# Patient Record
Sex: Male | Born: 1955 | Race: Black or African American | Hispanic: No | Smoking: Former smoker
Health system: Southern US, Community
[De-identification: ages and names within clinical notes are randomized; demographics above are authoritative.]

## PROBLEM LIST (undated history)

## (undated) DIAGNOSIS — I509 Heart failure, unspecified: Secondary | ICD-10-CM

## (undated) DIAGNOSIS — I4891 Unspecified atrial fibrillation: Secondary | ICD-10-CM

## (undated) DIAGNOSIS — E119 Type 2 diabetes mellitus without complications: Secondary | ICD-10-CM

## (undated) DIAGNOSIS — M109 Gout, unspecified: Secondary | ICD-10-CM

## (undated) DIAGNOSIS — E7800 Pure hypercholesterolemia, unspecified: Secondary | ICD-10-CM

## (undated) DIAGNOSIS — I10 Essential (primary) hypertension: Secondary | ICD-10-CM

## (undated) HISTORY — DX: Heart failure, unspecified: I50.9

## (undated) HISTORY — PX: OTHER SURGICAL HISTORY: SHX169

## (undated) HISTORY — DX: Pure hypercholesterolemia, unspecified: E78.00

## (undated) HISTORY — DX: Type 2 diabetes mellitus without complications: E11.9

## (undated) HISTORY — DX: Gout, unspecified: M10.9

## (undated) HISTORY — DX: Essential (primary) hypertension: I10

## (undated) HISTORY — DX: Unspecified atrial fibrillation: I48.91

## (undated) HISTORY — PX: MULTIPLE TOOTH EXTRACTIONS: SHX2053

---

## 2015-10-25 HISTORY — PX: COLONOSCOPY: SHX174

## 2017-02-03 ENCOUNTER — Encounter: Payer: Self-pay | Admitting: Internal Medicine

## 2017-02-21 HISTORY — PX: PACEMAKER PLACEMENT: SHX43

## 2017-03-05 ENCOUNTER — Encounter: Payer: Self-pay | Admitting: Gastroenterology

## 2017-03-05 ENCOUNTER — Ambulatory Visit (INDEPENDENT_AMBULATORY_CARE_PROVIDER_SITE_OTHER): Payer: Medicare Other | Admitting: Gastroenterology

## 2017-03-05 VITALS — BP 110/68 | HR 62 | Temp 97.0°F | Ht 72.0 in | Wt 223.2 lb

## 2017-03-05 DIAGNOSIS — R7989 Other specified abnormal findings of blood chemistry: Secondary | ICD-10-CM | POA: Diagnosis not present

## 2017-03-05 DIAGNOSIS — R945 Abnormal results of liver function studies: Principal | ICD-10-CM

## 2017-03-05 NOTE — Patient Instructions (Signed)
Please have blood work done today.  I am getting the results from [place]. Most likely, you will need an updated CT.  Further recommendations to follow!

## 2017-03-05 NOTE — Progress Notes (Signed)
Primary Care Physician:  Margy Clarks, NP Primary Gastroenterologist:  Dr. Gala Romney   Chief Complaint  Patient presents with  . Weight Loss    HPI:   Phillip Spears is a 61 y.o. male presenting today at the request of his PCP secondary to weight loss.  Labs dated May 2018 from [place] note Hgb 10.8, Hct 35.5, microcytic anemia, platelets 232, Albumin 3.2, BUN 32, Cr 1.77, Tbili 1.6, Alk Phos 245, AST 203, ALT 114, A1c 8.4, Outside labs from March 01, 2017 at [facility] with bilirubin 3.6, Alk Phos 191, Albumin 1.7, AST 134, ALT 79, Platelets 139  AICD placed in June 2018 at [facility]. History of chronic systolic heart failure, non-ischemic cardiomyopathy, afib.   Ex-wife, who is a friend, is present at visit.  Colonoscopy Feb 2017: Dr. Earley Brooke. Adenomatous polyps.   US abdomen done a few months ago per patient, but I do not have these results. Lost from 235 to 220. Feels nauseated but denies pain. Nausea is better since AICD placed. Doesn't have much of an appetite. Food doesn't taste the same for about a month. Swelling in lower extremities much improved since AICD placement. Amiodarone for years. Cut it back a few months ago from twice a day to once a day. History of alcohol use, quitting 4 months ago.   CT chest completed May 2018: negative for occult malignancy, no acute findings in chest.   Past Medical History:  Diagnosis Date  . Atrial fibrillation ([place])   . Diabetes ([place])   . Gout   . Heart failure ([place])   . Hypercholesterolemia   . Hypertension     Past Surgical History:  Procedure Laterality Date  . COLONOSCOPY  10/2015   Dr. Earley Brooke: hepatic flexure polyps (adenomatous), diverticula, small internal hemorrhoids. Remote history of adenomas as well.   . MULTIPLE TOOTH EXTRACTIONS    . PACEMAKER PLACEMENT  02/2017   ACID at [facility]  . venous surgery bilateral legs      Current Outpatient Prescriptions  Medication Sig Dispense Refill  . allopurinol (ZYLOPRIM) 300 MG tablet  Take by mouth.    Marland Kitchen amiodarone (PACERONE) 200 MG tablet Take by mouth.    . furosemide (LASIX) 40 MG tablet Take 40 mg by mouth 2 (two) times daily.    . insulin aspart (NOVOLOG FLEXPEN) 100 UNIT/ML FlexPen Sliding scale    . insulin detemir (LEVEMIR) 100 UNIT/ML injection 40 units SQ QAM then 30 units SQ QHS    . linagliptin (TRADJENTA) 5 MG TABS tablet Take by mouth.    . metoprolol succinate (TOPROL-XL) 100 MG 24 hr tablet Take by mouth.    . rosuvastatin (CRESTOR) 10 MG tablet Take by mouth.    . warfarin (COUMADIN) 5 MG tablet Take by mouth.     No current facility-administered medications for this visit.     Allergies as of 03/05/2017 - Review Complete 03/05/2017  Allergen Reaction Noted  . Amoxicillin Diarrhea 02/27/2017  . Enalapril Cough 02/03/2017  . Metformin hcl Diarrhea 02/03/2017  . Olmesartan Other (See Comments) 02/27/2017    Family History  Problem Relation Age of Onset  . Colon cancer Neg Hx   . Pancreatic cancer Neg Hx     Social History   Social History  . Marital status: Unknown    Spouse name: N/A  . Number of children: N/A  . Years of education: N/A   Occupational History  . Not on file.   Social History Main Topics  .  Smoking status: Former Smoker    Packs/day: 1.00    Quit date: 2008  . Smokeless tobacco: Never Used  . Alcohol use No     Comment: quit 4 months ago. sometimes drink on the weekends, sometimes just during the week   . Drug use: No  . Sexual activity: Not on file   Other Topics Concern  . Not on file   Social History Narrative  . No narrative on file    Review of Systems: Gen: see HPI  CV: Denies chest pain, heart palpitations, peripheral edema, syncope.  Resp: +DOE  GI: see HPI  GU : Denies urinary burning, urinary frequency, urinary hesitancy MS: gout  Derm: Denies rash, itching, dry skin Psych: Denies depression, anxiety, memory loss, and confusion Heme: Denies bruising, bleeding, and enlarged lymph  nodes.  Physical Exam: BP 110/68   Pulse 62   Temp 97 F (36.1 C) (Oral)   Ht 6' (1.829 m)   Wt 223 lb 3.2 oz (101.2 kg)   BMI 30.27 kg/m  General:   Alert and oriented. Pleasant and cooperative. Well-nourished and well-developed.  Head:  Normocephalic and atraumatic. Eyes:  Mild scleral icterus  Ears:  Normal auditory acuity. Nose:  No deformity, discharge,  or lesions. Mouth:  No deformity or lesions, oral mucosa pink.  Lungs:  Clear to auscultation bilaterally. No wheezes, rales, or rhonchi. No distress.  Heart:  S1, S2 present without murmurs appreciated.  Abdomen:  +BS, soft, non-tender and non-distended. No HSM noted. No guarding or rebound. No masses appreciated.  Rectal:  Deferred  Msk:  Symmetrical without gross deformities. Normal posture. Extremities:  Without edema. Neurologic:  Alert and  oriented x4 Psych:  Alert and cooperative. Normal mood and affect.

## 2017-03-06 LAB — BASIC METABOLIC PANEL WITH GFR
BUN: 44 mg/dL — ABNORMAL HIGH (ref 7–25)
CALCIUM: 8.2 mg/dL — AB (ref 8.6–10.3)
CO2: 21 mmol/L (ref 20–31)
Chloride: 101 mmol/L (ref 98–110)
Creat: 2.05 mg/dL — ABNORMAL HIGH (ref 0.70–1.33)
GFR, EST NON AFRICAN AMERICAN: 34 mL/min — AB (ref >=60)
GFR, Est African American: 40 mL/min — ABNORMAL LOW (ref >=60)
GLUCOSE: 239 mg/dL — AB (ref 65–99)
Potassium: 4.8 mmol/L (ref 3.5–5.3)
Sodium: 135 mmol/L (ref 135–146)

## 2017-03-06 LAB — HEPATITIS A ANTIBODY, TOTAL: HEP A TOTAL AB: NONREACTIVE

## 2017-03-06 LAB — HEPATIC FUNCTION PANEL
ALK PHOS: 267 U/L — AB (ref 40–115)
ALT: 107 U/L — AB (ref 9–46)
AST: 218 U/L — AB (ref 10–35)
Albumin: 2.6 g/dL — ABNORMAL LOW (ref 3.6–5.1)
BILIRUBIN INDIRECT: 1.5 mg/dL — AB (ref 0.2–1.2)
Bilirubin, Direct: 1.5 mg/dL — ABNORMAL HIGH (ref <=0.2)
TOTAL PROTEIN: 6.1 g/dL (ref 6.1–8.1)
Total Bilirubin: 3 mg/dL — ABNORMAL HIGH (ref 0.2–1.2)

## 2017-03-06 LAB — FERRITIN: Ferritin: 253 ng/mL (ref 20–380)

## 2017-03-06 LAB — IRON AND TIBC
%SAT: 22 % (ref 15–60)
Iron: 46 ug/dL — ABNORMAL LOW (ref 50–180)
TIBC: 213 ug/dL — ABNORMAL LOW (ref 250–425)
UIBC: 167 ug/dL

## 2017-03-06 LAB — HEPATITIS B SURFACE ANTIBODY,QUALITATIVE: HEP B S AB: NEGATIVE

## 2017-03-06 LAB — HEPATITIS C ANTIBODY: HCV AB: NEGATIVE

## 2017-03-06 LAB — HEPATITIS B CORE ANTIBODY, TOTAL: HEP B C TOTAL AB: NONREACTIVE

## 2017-03-06 LAB — HEPATITIS B SURFACE ANTIGEN: HEP B S AG: NEGATIVE

## 2017-03-07 ENCOUNTER — Encounter: Payer: Self-pay | Admitting: Gastroenterology

## 2017-03-07 ENCOUNTER — Other Ambulatory Visit: Payer: Self-pay

## 2017-03-07 DIAGNOSIS — R7989 Other specified abnormal findings of blood chemistry: Secondary | ICD-10-CM | POA: Insufficient documentation

## 2017-03-07 DIAGNOSIS — R945 Abnormal results of liver function studies: Principal | ICD-10-CM

## 2017-03-07 DIAGNOSIS — R634 Abnormal weight loss: Secondary | ICD-10-CM

## 2017-03-07 NOTE — Assessment & Plan Note (Signed)
61 year old male presenting with weight loss, elevated LFTs that are trending upwards in mixed pattern, and reported fatty liver. No recent CT completed that I am aware other than CT chest. No abdominal imaging in the way of CT. Extensive cardiac history, with [facility]. Recovering well from this. Colonoscopy up-to-date as of last year with adenomatous polyps. No EGD that I am aware.  Check HFP, hepatitis serologies, iron studies in setting of anemia. CT abd/pelvis with oral contrast and reduced dose IV contrast scheduled for Monday. Further recommendations to follow. Does not appear acutely ill at time of visit. Continue ETOH cessation, which he stopped 4 months ago.

## 2017-03-07 NOTE — Progress Notes (Signed)
Ferritin elevated at 253 but sats are 22, unlikely iron overload. Viral markers negative. LFTs elevated in mixed pattern, similar to prior results but have been increasing. I reviewed outside studies. He only had a CT chest that was negative.Marland Kitchen. Unfortunately, his renal function is not the best. I spoke with radiology, and can give reduced dose of IV contrast if GFR greater than 30. However, let's do the following:  1. Needs CT abd/pelvis with REDUCED DOSING OF IV CONTRAST (put in comments) and still have oral contrast. Check creatinine stat prior to CT in case he is unable to have any IV contrast. Reason: increasing LFTs 2. Needs to be Monday at the latest. May need to do stat if possible.

## 2017-03-10 ENCOUNTER — Ambulatory Visit (HOSPITAL_COMMUNITY)
Admission: RE | Admit: 2017-03-10 | Discharge: 2017-03-10 | Disposition: A | Discharge status: Home / Self Care | Payer: Medicare Other | Source: Ambulatory Visit | Attending: Gastroenterology | Admitting: Gastroenterology

## 2017-03-10 DIAGNOSIS — R7989 Other specified abnormal findings of blood chemistry: Secondary | ICD-10-CM | POA: Insufficient documentation

## 2017-03-10 DIAGNOSIS — R634 Abnormal weight loss: Secondary | ICD-10-CM | POA: Insufficient documentation

## 2017-03-10 DIAGNOSIS — R938 Abnormal findings on diagnostic imaging of other specified body structures: Secondary | ICD-10-CM | POA: Insufficient documentation

## 2017-03-10 DIAGNOSIS — K5730 Diverticulosis of large intestine without perforation or abscess without bleeding: Secondary | ICD-10-CM | POA: Insufficient documentation

## 2017-03-10 DIAGNOSIS — I700 Atherosclerosis of aorta: Secondary | ICD-10-CM | POA: Diagnosis not present

## 2017-03-10 DIAGNOSIS — R932 Abnormal findings on diagnostic imaging of liver and biliary tract: Secondary | ICD-10-CM | POA: Insufficient documentation

## 2017-03-10 DIAGNOSIS — R945 Abnormal results of liver function studies: Secondary | ICD-10-CM

## 2017-03-10 MED ORDER — IOPAMIDOL (ISOVUE-300) INJECTION 61%
INTRAVENOUS | Status: AC
  Administered 2017-03-10: 80 mL
  Filled 2017-03-10: qty 30

## 2017-03-10 NOTE — Progress Notes (Signed)
CC'ED TO PCP 

## 2017-03-12 NOTE — Progress Notes (Signed)
CT with likely cirrhosis. No acute findings. With elevated LFTs and cirrhosis need to continue absolute avoidance of ETOH.  Appears to have chronic anemia. Negative viral markers. Needs additional labs to include: AMA, ANA, ASMA, ceruloplasmin, alpha-1 antitrypsin. Return in 4-6 weeks, may use urgent.

## 2017-03-12 NOTE — Progress Notes (Signed)
Also, he will need repeat CT chest without contrast in 12 months. Will defer this to PCP. Please copy this imaging to them with this addendum.

## 2017-03-13 ENCOUNTER — Telehealth: Payer: Self-pay

## 2017-03-13 ENCOUNTER — Other Ambulatory Visit: Payer: Self-pay

## 2017-03-13 ENCOUNTER — Other Ambulatory Visit: Payer: Self-pay | Admitting: Gastroenterology

## 2017-03-13 DIAGNOSIS — K7460 Unspecified cirrhosis of liver: Secondary | ICD-10-CM

## 2017-03-13 MED ORDER — ONDANSETRON HCL 4 MG PO TABS: 4.0000 mg | ORAL_TABLET | Freq: Four times a day (QID) | ORAL | 2 refills | Status: AC | PRN

## 2017-03-13 NOTE — Telephone Encounter (Signed)
He is aware that rx for Zofran was sent to pharmacy.

## 2017-03-13 NOTE — Telephone Encounter (Signed)
Called and informed pt.  

## 2017-03-13 NOTE — Telephone Encounter (Signed)
Pt's daughter Phillip Spears(Marisa) called office and requested that medicine for nausea be sent to [facility] in [place]. She said he is nauseated, unable to eat and when he tries to eat it comes back up. She said he had CT scan 03/10/17 and he hasn't heard anything. AB seen him in office 03/05/17. Informed her that AB was on vacation but I would send the message to another provider.   No information was given to the daughter about CT results d/t no "release of information" form is in his chart. Routing to EG in AB's absence.

## 2017-03-13 NOTE — Telephone Encounter (Signed)
CT result has note from [facility] that states patient has received the results. I can send something in for nausea to help his symptoms.

## 2017-03-14 ENCOUNTER — Encounter: Payer: Self-pay | Admitting: Internal Medicine

## 2017-03-14 NOTE — Progress Notes (Signed)
APPT MADE AND LETTER SENT  °

## 2017-04-11 ENCOUNTER — Ambulatory Visit: Payer: Medicare Other | Admitting: Nurse Practitioner

## 2017-04-23 DEATH — deceased

## 2017-04-30 ENCOUNTER — Ambulatory Visit: Payer: Medicare Other | Admitting: Gastroenterology

## 2018-04-18 IMAGING — CT CT ABD-PELV W/ CM
2 of 5 series · 16 of 46 positions shown, 18 images · IV contrast (Isovue)
Comparison: None.

ADDENDUM:
Omitted from IMPRESSION:

5 mm RIGHT lung base nodule, recommendation below.
No follow-up needed if patient is low-risk. Non-contrast chest CT
can be considered in 12 months if patient is high-risk. This
recommendation follows the consensus statement: Guidelines for
Management of Incidental Pulmonary Nodules Detected on CT Images:
CLINICAL DATA: Increasing LFTs, weight loss, hypertension, diabetes
mellitus, heart failure, atrial fibrillation, gout
EXAM:
CT ABDOMEN AND PELVIS WITH CONTRAST
TECHNIQUE: Multidetector CT imaging of the abdomen and pelvis was performed
using the standard protocol following bolus administration of
intravenous contrast. Sagittal and coronal MPR images reconstructed
from axial data set.
CONTRAST:  80mL Z8THXL-IZZ IOPAMIDOL (Z8THXL-IZZ) INJECTION 61% IV.
Dilute oral contrast.

[Series 2: axial st · axial · 0.91mm/px · z∈[-655,-205]mm · 13 of 102 slices shown, 15 images]
[im 6/102  soft-tissue]
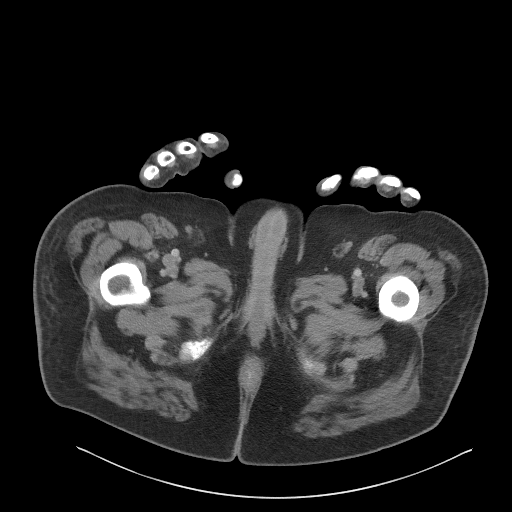
[im 6/102  bone]
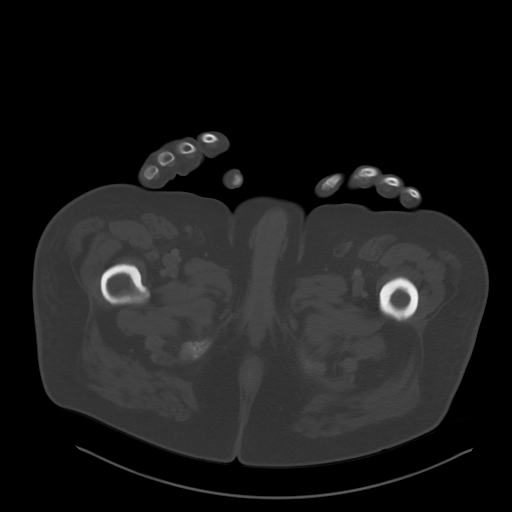
[im 16/102  soft-tissue]
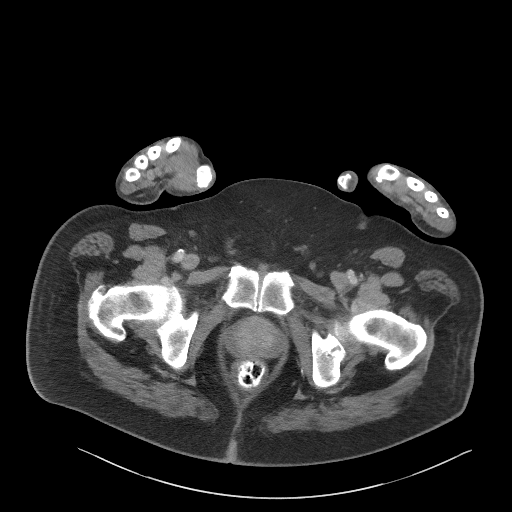
[im 22/102  soft-tissue]
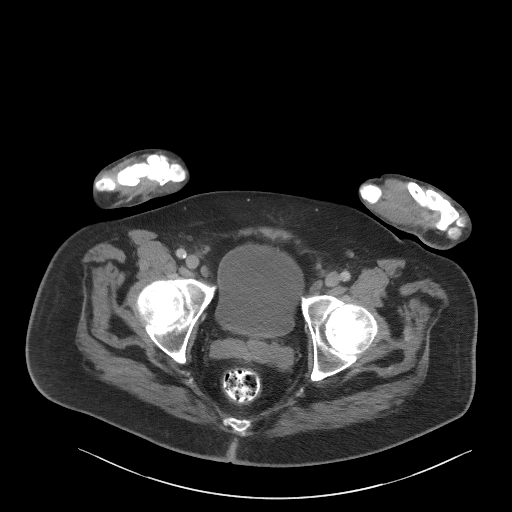
[im 27/102  soft-tissue]
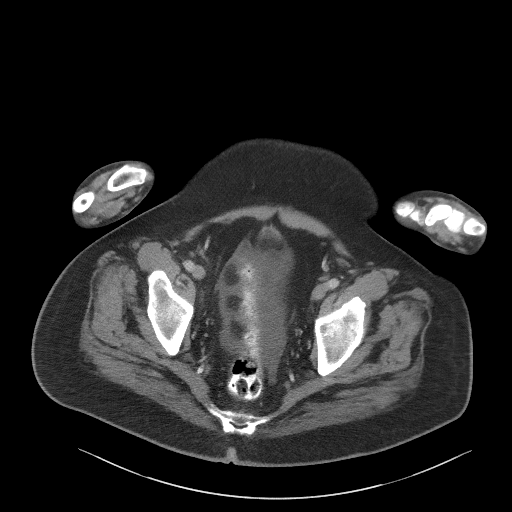
[im 38/102  soft-tissue]
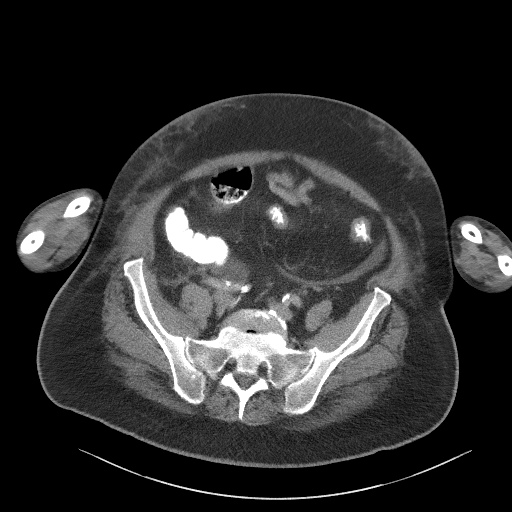
[im 43/102  soft-tissue]
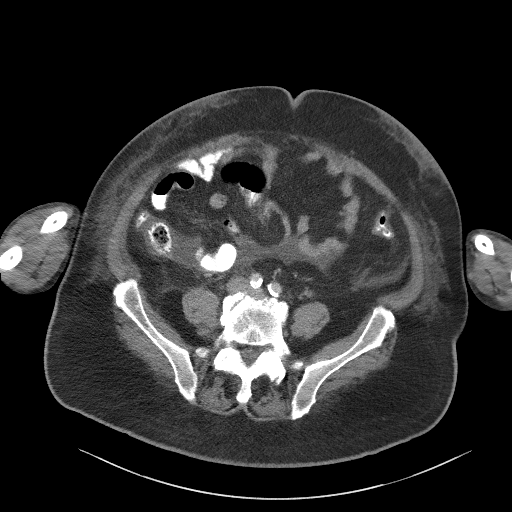
[im 54/102  soft-tissue]
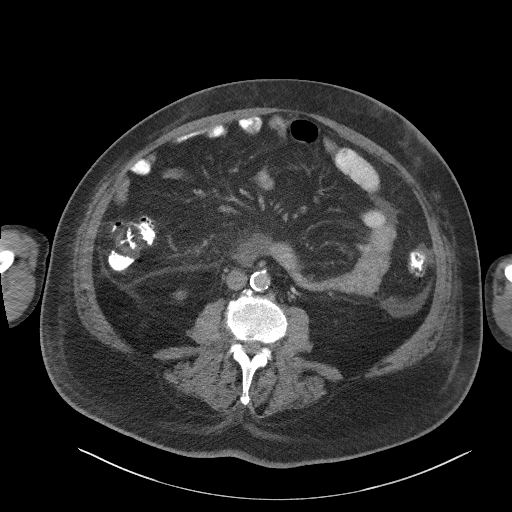
[im 59/102  soft-tissue]
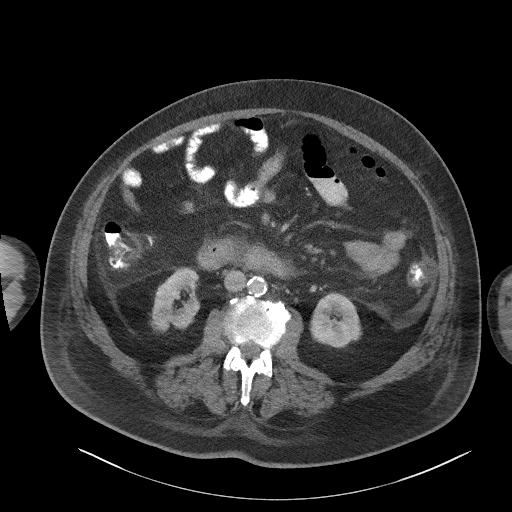
[im 64/102  soft-tissue]
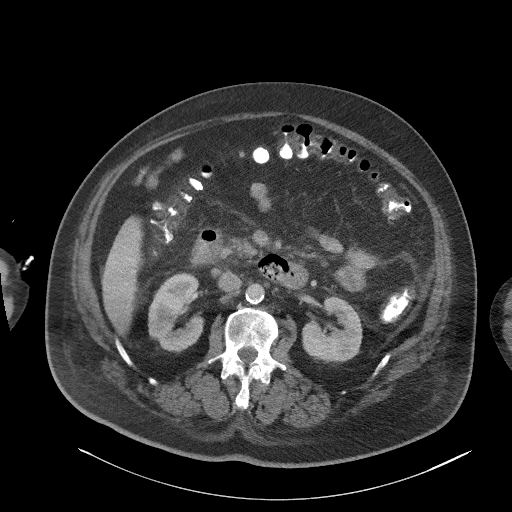
[im 64/102  bone]
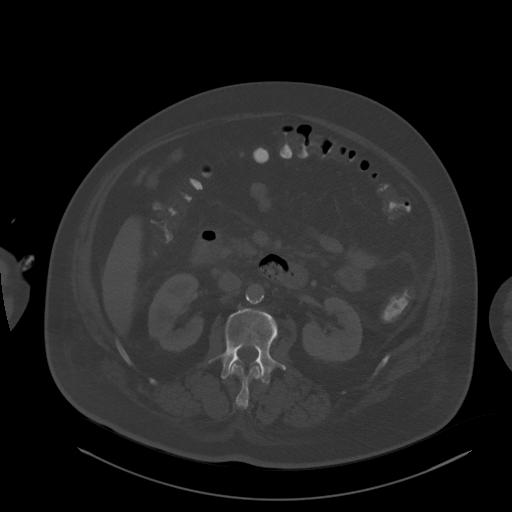
[im 75/102  soft-tissue]
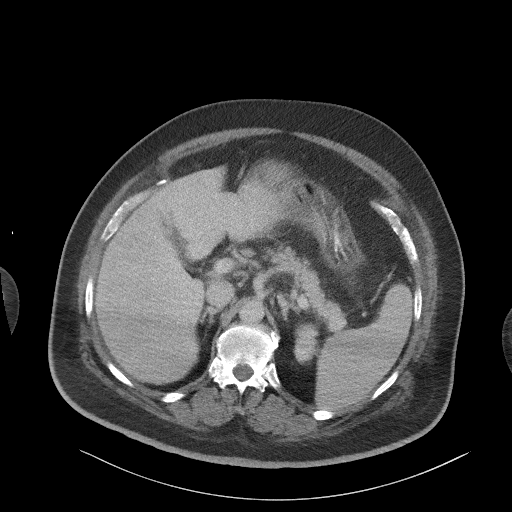
[im 80/102  soft-tissue]
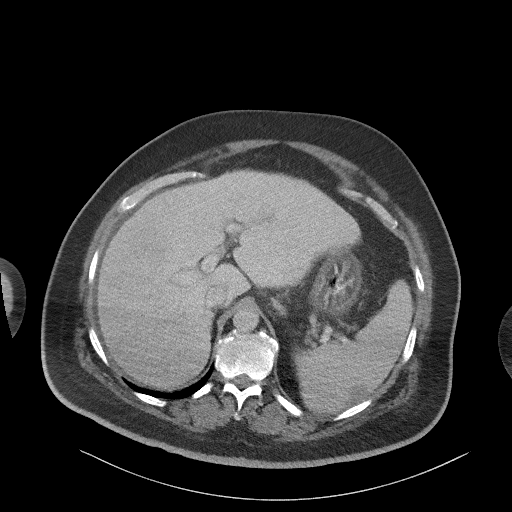
[im 86/102  soft-tissue]
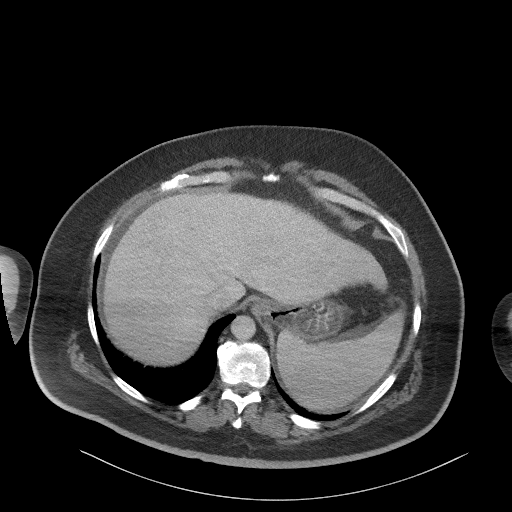
[im 96/102  soft-tissue]
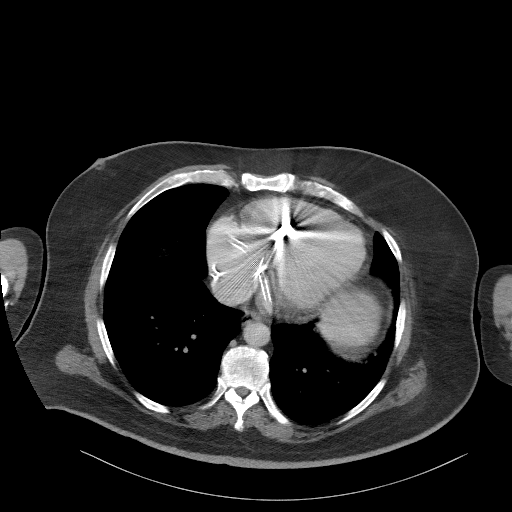

[Series 5: coronal st · coronal · 0.88mm/px · 3 of 124 slices shown]
[im 42/124  soft-tissue]
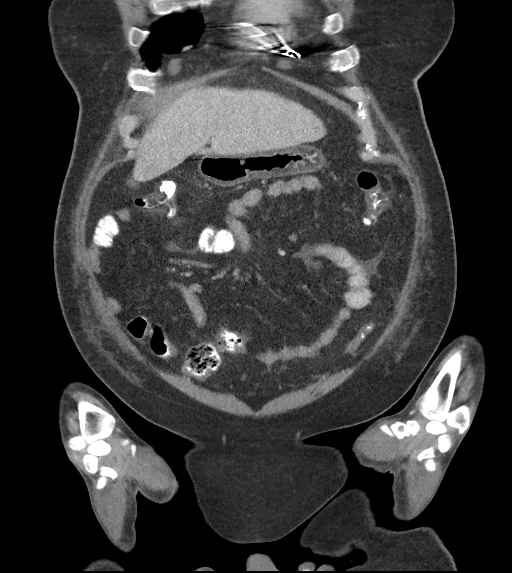
[im 55/124  soft-tissue]
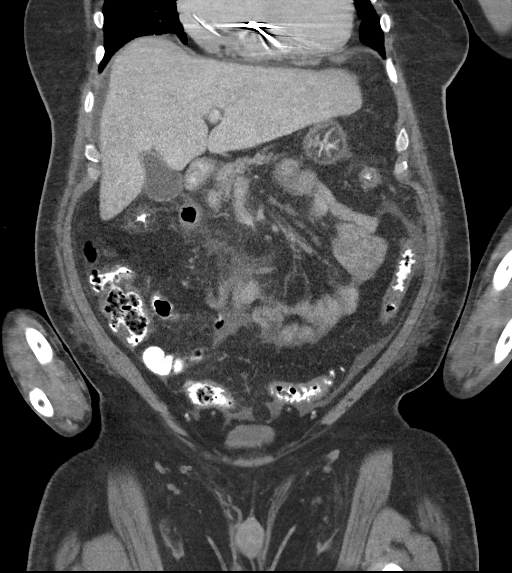
[im 69/124  soft-tissue]
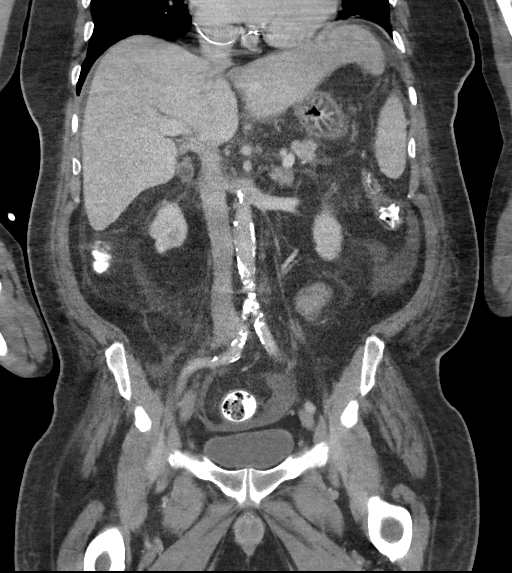

[16 of 46 positions shown; findings below may reference images not displayed]

FINDINGS: Lower chest: Linear subsegmental atelectasis LEFT lower lobe. 5 mm
RIGHT lung base nodule image 3.

Hepatobiliary: Subtle nodularity of hepatic margins suggesting
cirrhosis. No focal hepatic mass. Gallbladder unremarkable.

Pancreas: Normal appearance

Spleen: 8.2 x 14.5 x 12.9 cm (volume = 800 cm^3). Several
peripheral low-attenuation foci with broad capsular bases identified
at the lateral spleen suspect small infarcts.

Adrenals/Urinary Tract: Adrenal glands unremarkable. Cortical scars
RIGHT kidney. No definite renal mass or hydronephrosis. Bladder and
ureters unremarkable.

Stomach/Bowel: Sigmoid diverticulosis without evidence of
diverticulitis. Normal appendix. Stomach decompressed. Bowel loops
otherwise unremarkable.

Vascular/Lymphatic: Atherosclerotic calcification aorta without
aneurysm. Iliac arterial calcifications. No adenopathy. Pacemaker
leads at RIGHT atrium, RIGHT ventricle, and coronary sinus.

Reproductive: Minimal prostatic enlargement. Seminal vesicles
unremarkable.

Other: Scattered small amount of ascites. Mild retroperitoneal edema
particularly adjacent to the duodenum though else or throughout the
small bowel mesentery as well. No free air. Scattered subcutaneous
edema in the anterior abdominal wall.

Musculoskeletal: Degenerative disc disease changes of the
thoracolumbar spine. No acute bony lesions.
IMPRESSION: Suspected cirrhotic liver with mild splenomegaly and minimal
ascites.

Small peripheral low-attenuation foci in the spleen favoring small
splenic infarcts.

Sigmoid diverticulosis without evidence diverticulitis.

Aortic atherosclerosis.

Aortic Atherosclerosis ([id number]).
